# Patient Record
Sex: Male | Born: 1985 | Race: Black or African American | Hispanic: No | Marital: Married | Smoking: Never smoker
Health system: Southern US, Community
[De-identification: ages and names within clinical notes are randomized; demographics above are authoritative.]

## PROBLEM LIST (undated history)

## (undated) HISTORY — PX: VASECTOMY: SHX75

## (undated) HISTORY — PX: KNEE SURGERY: SHX244

---

## 2020-09-27 ENCOUNTER — Encounter (HOSPITAL_COMMUNITY): Payer: Self-pay

## 2020-09-27 ENCOUNTER — Ambulatory Visit (HOSPITAL_COMMUNITY)
Admission: EM | Admit: 2020-09-27 | Discharge: 2020-09-27 | Disposition: A | Discharge status: Home / Self Care | Payer: Commercial Managed Care - PPO | Attending: Internal Medicine | Admitting: Internal Medicine

## 2020-09-27 ENCOUNTER — Ambulatory Visit (INDEPENDENT_AMBULATORY_CARE_PROVIDER_SITE_OTHER): Payer: Commercial Managed Care - PPO

## 2020-09-27 ENCOUNTER — Other Ambulatory Visit: Payer: Self-pay

## 2020-09-27 DIAGNOSIS — S39012A Strain of muscle, fascia and tendon of lower back, initial encounter: Secondary | ICD-10-CM | POA: Diagnosis not present

## 2020-09-27 DIAGNOSIS — M545 Low back pain, unspecified: Secondary | ICD-10-CM

## 2020-09-27 MED ORDER — PREDNISONE 10 MG (21) PO TBPK: ORAL_TABLET | ORAL | 0 refills | Status: AC

## 2020-09-27 NOTE — Discharge Instructions (Addendum)
The x-rays of your back were normal.  You have a strain in your low back muscle.  I have given you a prescription for steroids which she will take over the next 6 days that should help with your pain.  You can also get lidocaine patches over-the-counter at the pharmacy that you can place on the area of your pain to help with this.  Heat can help as well.  You can find a chiropractor in the area as he would like which can help with your pain.  I would let pain be your guide when you are exercising.  Reasons to go to the emergency room right away include numbness and tingling in your groin, sudden weakness in your legs, difficulty urinating, stooling on yourself, fever with back pain.

## 2020-09-27 NOTE — ED Provider Notes (Signed)
[facility]    CSN: [id number] Arrival date & time: 09/27/20  1737      History   Chief Complaint Chief Complaint  Patient presents with   Back Pain    HPI Jason Patrick is a 35 y.o. male.   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 No pertinent past medical history.  There are no problems to display for this patient.   Past Surgical History:  Procedure Laterality Date   KNEE SURGERY     VASECTOMY         Home Medications    Prior to Admission medications   Medication Sig Start Date End Date Taking? Authorizing Provider  predniSONE (STERAPRED UNI-PAK 21 TAB) 10 MG (21) TBPK tablet Take 6 tablets (60 mg total) by mouth daily for 1 day, THEN 5 tablets (50 mg total) daily for 1 day, THEN 4 tablets (40 mg total) daily for 1 day, THEN 3 tablets (30 mg total) daily for 1 day, THEN 2 tablets (20 mg total) daily for 1 day, THEN 1 tablet (10 mg total) daily for 1 day. 09/27/20 10/03/20 Yes Roderick Calo, Solmon Ice, DO    Family History Family History  Family history unknown: Yes    Social History Social History   Tobacco Use   Smoking status: Never   Smokeless tobacco: Never     Allergies    Patient has no known allergies.   Review of Systems Review of Systems  Constitutional:  Negative for fatigue and fever.  HENT:  Negative for congestion.   Respiratory:  Negative for shortness of breath.   Cardiovascular:  Negative for chest pain.  Gastrointestinal:  Negative for abdominal pain.  Genitourinary:  Negative for difficulty urinating and dysuria.  Musculoskeletal:  Positive for back pain. Negative for neck pain.  Skin:  Negative for rash.  Neurological:  Negative for dizziness, syncope, weakness, numbness and headaches.    Physical Exam Triage Vital Signs ED Triage Vitals [09/27/20 1829]  Enc Vitals Group     BP (!) 153/84     Pulse Rate 70     Resp 20     Temp 97.6 F (36.4 C)     Temp Source Oral     SpO2 98 %     Weight      Height      Head Circumference      Peak Flow      Pain Score 9     Pain Loc      Pain Edu?      Excl. in GC?    No data found.  Updated Vital Signs BP (!) 153/84 (BP Location: Right  Arm)   Pulse 70   Temp 97.6 F (36.4 C) (Oral)   Resp 20   SpO2 98%   Visual Acuity Right Eye Distance:   Left Eye Distance:   Bilateral Distance:    Right Eye Near:   Left Eye Near:    Bilateral Near:     Physical Exam Constitutional:      General: He is not in acute distress.    Appearance: Normal appearance. He is not ill-appearing.  HENT:     Head: Normocephalic and atraumatic.  Pulmonary:     Effort: Pulmonary effort is normal.     Breath sounds: Normal breath sounds.  Musculoskeletal:     Cervical back: Normal and normal range of motion.     Thoracic back: Normal.     Lumbar back: Decreased range of motion (decreased flexion to 90 degrees with pain noted at extreme, full extension, but with pain). Negative right straight leg raise test and negative left straight leg raise test.       Back:     Right lower leg: Normal. No deformity or tenderness. No edema.     Left lower leg: Normal. No deformity or tenderness. No edema.      Comments: Negative Stork b/l Negative FABER on right  Neurological:     Mental Status: He is alert.     Sensory: Sensation is intact.     Deep Tendon Reflexes:     Reflex Scores:      Patellar reflexes are 2+ on the right side and 2+ on the left side.      Achilles reflexes are 2+ on the right side and 2+ on the left side.    Comments: 5/5 strength BLE     UC Treatments / Results  Labs (all labs ordered are listed, but only abnormal results are displayed) Labs Reviewed - No data to display  EKG   Radiology DG Lumbar Spine Complete  Result Date: 09/27/2020 CLINICAL DATA:  severe lower back pain on right side since last night. EXAM: LUMBAR SPINE - COMPLETE 4+ VIEW COMPARISON:  None. FINDINGS: Five non-rib-bearing lumbar vertebral bodies. There is no evidence of lumbar spine fracture. Alignment is normal. Intervertebral disc spaces are maintained. IMPRESSION: Negative. Electronically Signed   By: Tish Frederickson M.D.   On: 09/27/2020 19:24    Procedures Procedures (including critical care time)  Medications Ordered in UC Medications - No data to display  Initial Impression / Assessment and Plan / UC Course  I have reviewed the triage vital signs and the nursing notes.  Pertinent labs & imaging results that were available during my care of the patient were reviewed by me and considered in my medical decision making (see chart for details).     Patient is a 35 year old male with no significant past medical history who presents with right-sided back pain since yesterday.  His review of systems is negative for red flag symptoms.  His exam is also reassuring and does not suggest any discogenic pathology.  He does not have any signs and symptoms of radiculopathy.  Is most likely that he has having a lumbosacral strain.  Location is near his SI joint, but he does not have tenderness at this region does not have pain with provocative testing.  Lumbar XR to be obtained to further assess  for fracture.  He was offered a toradol injection, but declined.  Lumbar films reviewed and without any significant abnormalities or degenerative changes.  Most likely consistent with  lumbar strain.  Will send prednisone Dosepak for patient to the pharmacy.  Also advised to use lidocaine patches over-the-counter as needed.  [facility]  Advised him to rest a little bit from exercise and to let pain be his guide.  Discussed ED precautions, see AVS.  Patient was discharged home in stable condition.  Final Clinical Impressions(s) / UC Diagnoses   Final diagnoses:  Strain of lumbar region, initial encounter     Discharge Instructions      The x-rays of your back were normal.  You have a strain in your low back muscle.  I have given you a prescription for steroids which she will take over the next 6 days that should help with your pain.  You can also get lidocaine patches over-the-counter at the pharmacy that you can place on the area of your pain to help with this.  Heat can help as well.  You can find a chiropractor in the area as he would like which can help with your pain.  I would let pain be your guide when you are exercising.  Reasons to go to the emergency room right away include numbness and tingling in your groin, sudden weakness in your legs, difficulty urinating, stooling on yourself, fever with back pain.     ED Prescriptions     Medication Sig Dispense Auth. Provider   predniSONE (STERAPRED UNI-PAK 21 TAB) 10 MG (21) TBPK tablet Take 6 tablets (60 mg total) by mouth daily for 1 day, THEN 5 tablets (50 mg total) daily for 1 day, THEN 4 tablets (40 mg total) daily for 1 day, THEN 3 tablets (30 mg total) daily for 1 day, THEN 2 tablets (20 mg total) daily for 1 day, THEN 1 tablet (10 mg total) daily for 1 day. 21 tablet Shriya Aker, Solmon Ice, DO      PDMP not reviewed this encounter.   Unknown Jim, DO 09/27/20 1930

## 2020-09-27 NOTE — ED Triage Notes (Signed)
Pt presents with severe lower back pain on right side since last night.

## 2022-07-23 IMAGING — DX DG LUMBAR SPINE COMPLETE 4+V
4 series · 4 of 4 positions shown · non-contrast
Comparison: None.

CLINICAL DATA: severe lower back pain on right side since last
night.

EXAM:
LUMBAR SPINE - COMPLETE 4+ VIEW

[l-spine ap]
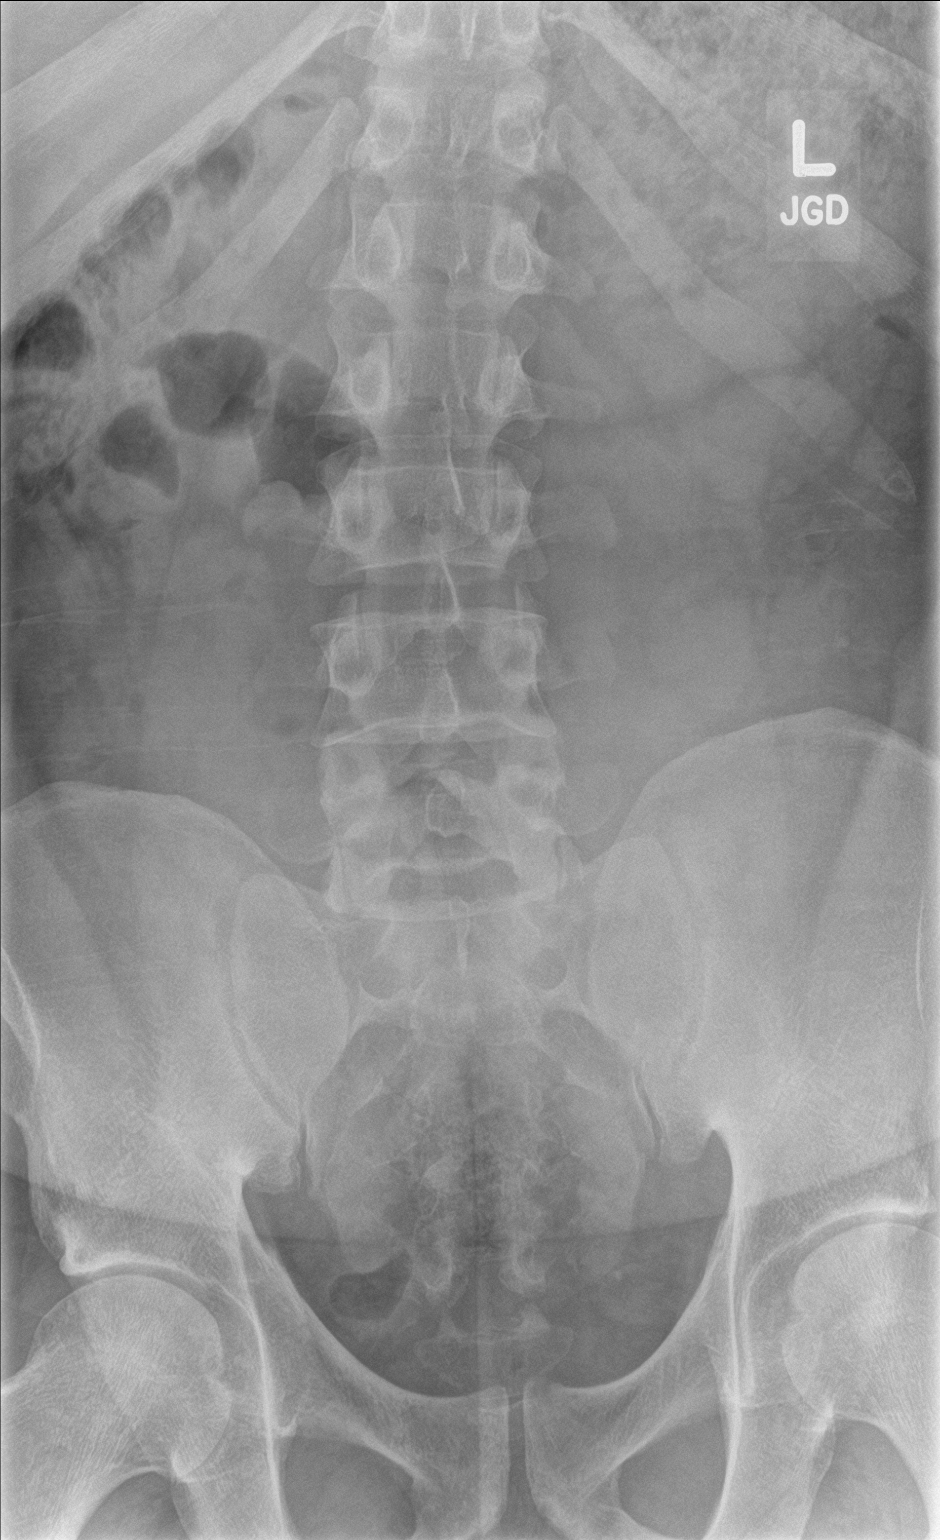

[l-spine obl (1 of 2)]
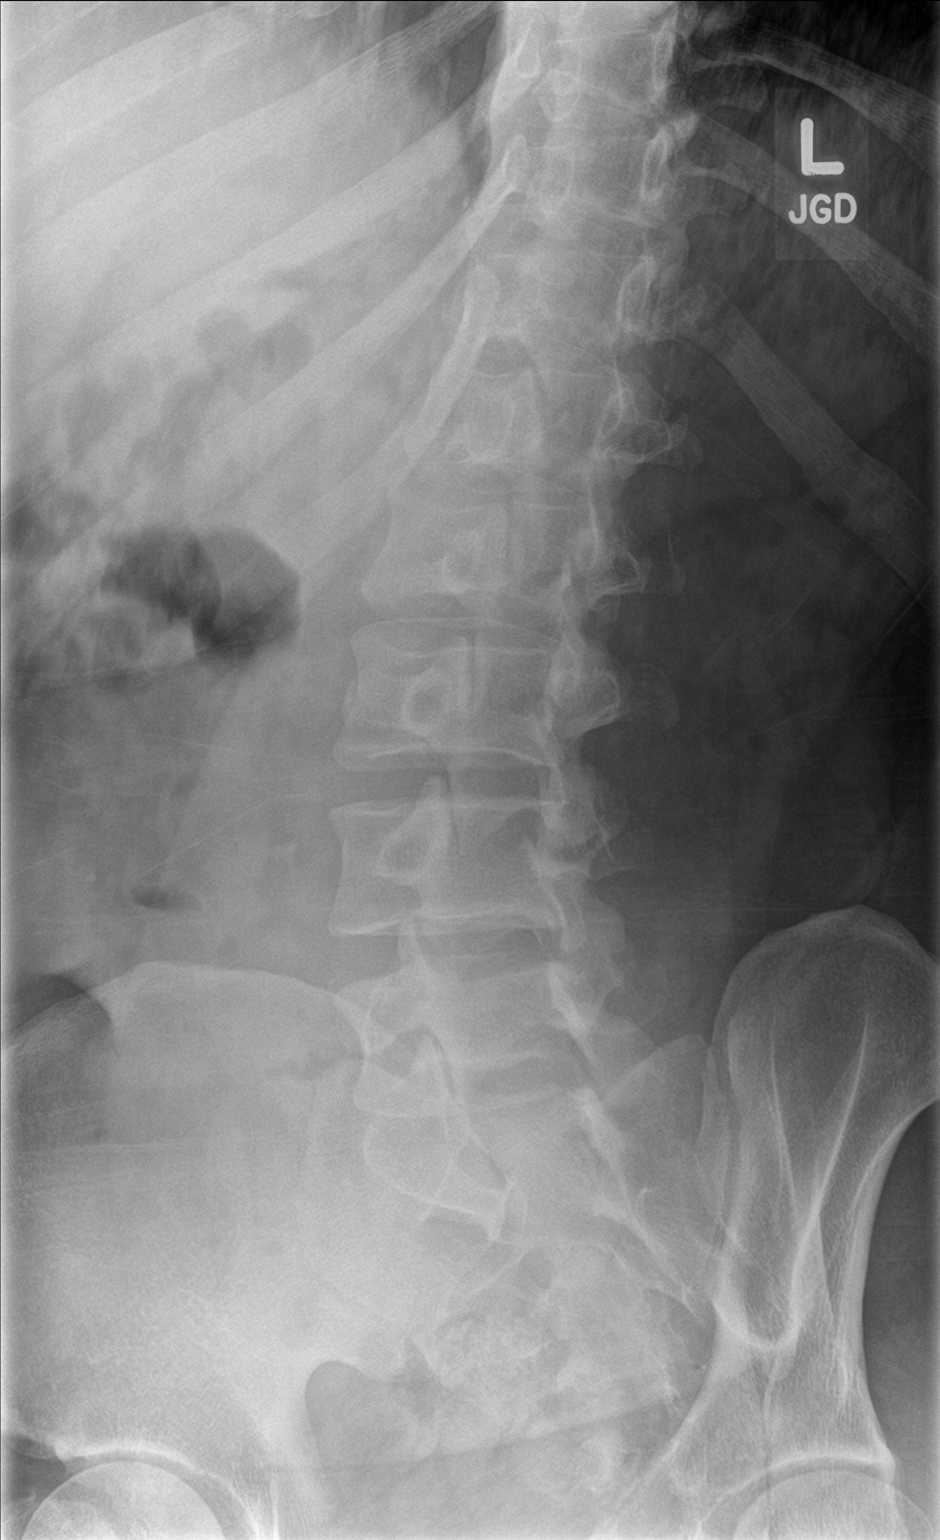

[l-spine obl (2 of 2)]
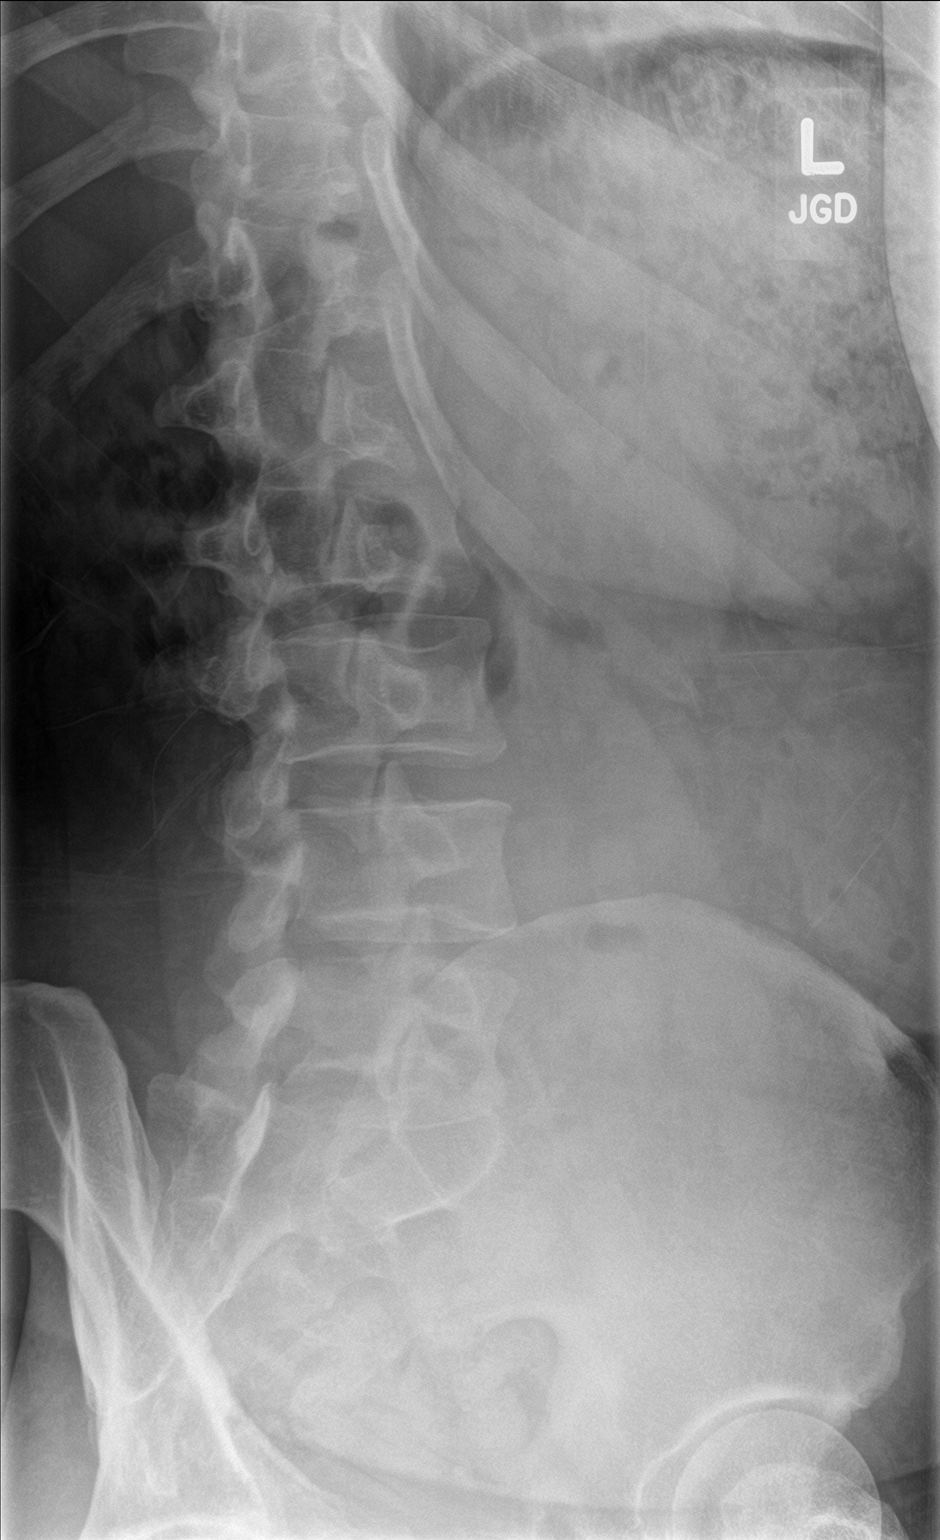

[l-spine lat]
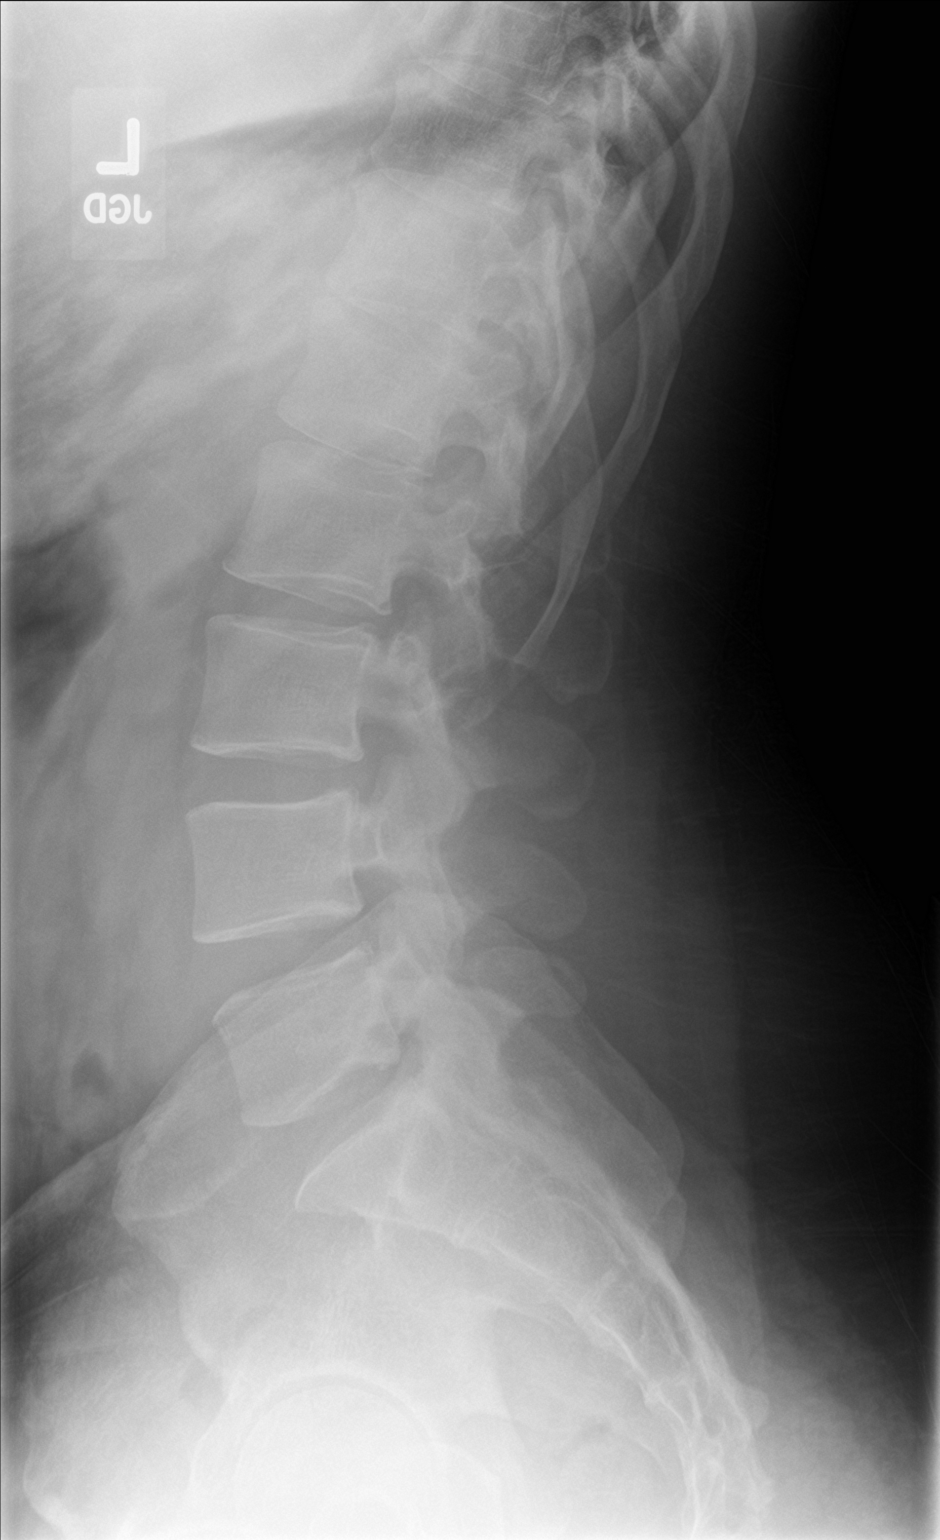

[4 of 4 positions shown; findings below may reference images not displayed]

FINDINGS: Five non-rib-bearing lumbar vertebral bodies. There is no evidence
of lumbar spine fracture. Alignment is normal. Intervertebral disc
spaces are maintained.
IMPRESSION: Negative.
